# Patient Record
Sex: Female | Born: 1975 | Race: White | Hispanic: No | Marital: Married | Smoking: Current every day smoker
Health system: Southern US, Community
[De-identification: ages and names within clinical notes are randomized; demographics above are authoritative.]

## PROBLEM LIST (undated history)

## (undated) DIAGNOSIS — M5430 Sciatica, unspecified side: Secondary | ICD-10-CM

## (undated) DIAGNOSIS — M5136 Other intervertebral disc degeneration, lumbar region: Secondary | ICD-10-CM

## (undated) DIAGNOSIS — M5137 Other intervertebral disc degeneration, lumbosacral region: Secondary | ICD-10-CM

## (undated) DIAGNOSIS — F419 Anxiety disorder, unspecified: Secondary | ICD-10-CM

## (undated) DIAGNOSIS — M5126 Other intervertebral disc displacement, lumbar region: Secondary | ICD-10-CM

## (undated) HISTORY — PX: LAPAROSCOPIC ENDOMETRIOSIS FULGURATION: SUR769

## (undated) HISTORY — PX: CLAVICLE SURGERY: SHX598

---

## 2013-02-07 ENCOUNTER — Encounter (HOSPITAL_COMMUNITY): Payer: Self-pay | Admitting: *Deleted

## 2013-02-07 ENCOUNTER — Emergency Department (HOSPITAL_COMMUNITY)
Admission: EM | Admit: 2013-02-07 | Discharge: 2013-02-08 | Disposition: A | Discharge status: Home / Self Care | Payer: Medicare Other | Attending: Emergency Medicine | Admitting: Emergency Medicine

## 2013-02-07 DIAGNOSIS — M545 Low back pain: Secondary | ICD-10-CM

## 2013-02-07 DIAGNOSIS — R209 Unspecified disturbances of skin sensation: Secondary | ICD-10-CM | POA: Insufficient documentation

## 2013-02-07 DIAGNOSIS — Z8739 Personal history of other diseases of the musculoskeletal system and connective tissue: Secondary | ICD-10-CM | POA: Insufficient documentation

## 2013-02-07 DIAGNOSIS — F17200 Nicotine dependence, unspecified, uncomplicated: Secondary | ICD-10-CM | POA: Insufficient documentation

## 2013-02-07 DIAGNOSIS — Y929 Unspecified place or not applicable: Secondary | ICD-10-CM | POA: Insufficient documentation

## 2013-02-07 DIAGNOSIS — X500XXA Overexertion from strenuous movement or load, initial encounter: Secondary | ICD-10-CM | POA: Insufficient documentation

## 2013-02-07 DIAGNOSIS — Y9389 Activity, other specified: Secondary | ICD-10-CM | POA: Insufficient documentation

## 2013-02-07 DIAGNOSIS — Z8659 Personal history of other mental and behavioral disorders: Secondary | ICD-10-CM | POA: Insufficient documentation

## 2013-02-07 DIAGNOSIS — T148XXA Other injury of unspecified body region, initial encounter: Secondary | ICD-10-CM

## 2013-02-07 DIAGNOSIS — IMO0002 Reserved for concepts with insufficient information to code with codable children: Secondary | ICD-10-CM | POA: Insufficient documentation

## 2013-02-07 HISTORY — DX: Anxiety disorder, unspecified: F41.9

## 2013-02-07 HISTORY — DX: Other intervertebral disc degeneration, lumbosacral region: M51.37

## 2013-02-07 MED ORDER — IBUPROFEN 800 MG PO TABS: 800.0000 mg | ORAL_TABLET | Freq: Three times a day (TID) | ORAL | Status: DC

## 2013-02-07 MED ORDER — CYCLOBENZAPRINE HCL 10 MG PO TABS: 10.0000 mg | ORAL_TABLET | Freq: Two times a day (BID) | ORAL | Status: DC | PRN

## 2013-02-07 MED ORDER — CYCLOBENZAPRINE HCL 10 MG PO TABS
10.0000 mg | ORAL_TABLET | Freq: Once | ORAL | Status: AC
  Administered 2013-02-07: 10 mg via ORAL
  Filled 2013-02-07: qty 1

## 2013-02-07 MED ORDER — HYDROCODONE-ACETAMINOPHEN 5-325 MG PO TABS: 1.0000 | ORAL_TABLET | ORAL | Status: DC | PRN

## 2013-02-07 MED ORDER — HYDROCODONE-ACETAMINOPHEN 5-325 MG PO TABS
1.0000 | ORAL_TABLET | Freq: Once | ORAL | Status: AC
  Administered 2013-02-07: 1 via ORAL
  Filled 2013-02-07: qty 1

## 2013-02-07 NOTE — ED Notes (Addendum)
C/o L low back pain, onset Thursday night, "felt a pop", gradually progressively worse, today developed radiation down L leg, describes as burning, worse when sitting. Very anxious. Keeps mentioning fearful of possibilities of sx.

## 2013-02-07 NOTE — ED Provider Notes (Signed)
History    This chart was scribed for non-physician practitioner, Arnoldo Hooker, PA-C, working with Hurman Horn, MD by Melba Coon, ED Scribe. This patient was seen in room TR10C/TR10C and the patient's care was started at 11:10PM.   CSN: [id number]  Arrival date & time 02/07/13  2204   First MD Initiated Contact with Patient 02/07/13 2245      Chief Complaint  Patient presents with  . Back Pain    (Consider location/radiation/quality/duration/timing/severity/associated sxs/prior treatment) The history is provided by the patient. No language interpreter was used.   Shawna Brown is a 37 y.o. female who presents to the Emergency Department complaining of constant, moderate to severe,burning, lower back pain that radiates down her left leg with numbness with a gradual onset 3 night ago that has gotten progressively worse. She reports a history of DDD without surgery. She reports she was helping to move a couch when she felt a "pop"; she does not think it was muscular. Sitting aggravates the back pain. Laying down and heating pad alleviates the back pain. She has not tried ice at home. She reports she cannot lift her left leg at times and has decreased strength in her left leg compared to her right leg. Denies HA, fever, neck pain, sore throat, rash, CP, SOB, abdominal pain, nausea, emesis, diarrhea, dysuria, bowel or bladder dysfunction, or extremity edema, weakness, or tingling. No IV drug abuse. History of anxiety. No known allergies. No other pertinent medical symptoms. She is a current everyday smoker.   Past Medical History  Diagnosis Date  . Anxiety   . DDD (degenerative disc disease), lumbosacral     childhood DX    Past Surgical History  Procedure Laterality Date  . Clavicle surgery      LEFT  . Laparoscopic endometriosis fulguration      No family history on file.  History  Substance Use Topics  . Smoking status: Current Every Day Smoker  . Smokeless tobacco:  Not on file  . Alcohol Use: Yes    OB History   Grav Para Term Preterm Abortions TAB SAB Ect Mult Living                  Review of Systems  Musculoskeletal: Positive for back pain.   10 Systems reviewed and all are negative for acute change except as noted in the HPI.   Allergies  Review of patient's allergies indicates no known allergies.  Home Medications   Current Outpatient Rx  Name  Route  Sig  Dispense  Refill  . ibuprofen (ADVIL,MOTRIN) 200 MG tablet   Oral   Take 800 mg by mouth every 6 (six) hours as needed for pain.           BP 118/63  Pulse 100  Temp(Src) 98.4 F (36.9 C) (Oral)  Resp 16  SpO2 100%  LMP 01/28/2013  Physical Exam  Nursing note and vitals reviewed. Constitutional: She is oriented to person, place, and time. She appears well-developed and well-nourished. No distress.  HENT:  Head: Normocephalic and atraumatic.  Eyes: EOM are normal.  Neck: Normal range of motion. Neck supple. No tracheal deviation present.  Cardiovascular: Normal rate.   Pulmonary/Chest: Effort normal. No respiratory distress.  Abdominal: Soft. There is no tenderness.  Musculoskeletal: Normal range of motion. She exhibits tenderness. She exhibits no edema.  Mild paralumbar tenderness without swelling with full ROM limited only by pain. Normal reflexes equal bilaterally with distal pulses intact and equal in  lower extremities.  Neurological: She is alert and oriented to person, place, and time.  Skin: Skin is warm and dry. No rash noted.  Psychiatric: She has a normal mood and affect. Her behavior is normal.    ED Course  Procedures (including critical care time)  DIAGNOSTIC STUDIES: Oxygen Saturation is 100% on room air, normal by my interpretation.    COORDINATION OF CARE:  11:15PM - Norco, ibuprofen, and flexeril will be ordered for Alric Quan. She is advised to take rest at home and apply ice to tender areas. She is ready for d/c.   Labs Reviewed -  No data to display No results found.   No diagnosis found. 1. Lower back pain   MDM  Uncomplicated low back pain following musculoskeletal pattern, without neurologic deficits.    I personally performed the services described in this documentation, which was scribed in my presence. The recorded information has been reviewed and is accurate.        Arnoldo Hooker, PA-C 02/09/13 0007

## 2013-02-09 NOTE — ED Provider Notes (Signed)
Medical screening examination/treatment/procedure(s) were performed by non-physician practitioner and as supervising physician I was immediately available for consultation/collaboration.   Murray Durrell M Lennan Malone, MD 02/09/13 1722 

## 2013-02-23 ENCOUNTER — Encounter (HOSPITAL_COMMUNITY): Payer: Self-pay | Admitting: Emergency Medicine

## 2013-02-23 ENCOUNTER — Emergency Department (HOSPITAL_COMMUNITY)
Admission: EM | Admit: 2013-02-23 | Discharge: 2013-02-23 | Disposition: A | Discharge status: Home / Self Care | Payer: Medicare Other | Attending: Emergency Medicine | Admitting: Emergency Medicine

## 2013-02-23 DIAGNOSIS — M545 Low back pain, unspecified: Secondary | ICD-10-CM | POA: Insufficient documentation

## 2013-02-23 DIAGNOSIS — Z8739 Personal history of other diseases of the musculoskeletal system and connective tissue: Secondary | ICD-10-CM | POA: Insufficient documentation

## 2013-02-23 DIAGNOSIS — R209 Unspecified disturbances of skin sensation: Secondary | ICD-10-CM | POA: Insufficient documentation

## 2013-02-23 DIAGNOSIS — M5430 Sciatica, unspecified side: Secondary | ICD-10-CM | POA: Insufficient documentation

## 2013-02-23 DIAGNOSIS — G8929 Other chronic pain: Secondary | ICD-10-CM | POA: Insufficient documentation

## 2013-02-23 DIAGNOSIS — R5381 Other malaise: Secondary | ICD-10-CM | POA: Insufficient documentation

## 2013-02-23 DIAGNOSIS — F17200 Nicotine dependence, unspecified, uncomplicated: Secondary | ICD-10-CM | POA: Insufficient documentation

## 2013-02-23 DIAGNOSIS — Z8659 Personal history of other mental and behavioral disorders: Secondary | ICD-10-CM | POA: Insufficient documentation

## 2013-02-23 HISTORY — DX: Other intervertebral disc displacement, lumbar region: M51.26

## 2013-02-23 HISTORY — DX: Sciatica, unspecified side: M54.30

## 2013-02-23 HISTORY — DX: Other intervertebral disc degeneration, lumbar region: M51.36

## 2013-02-23 MED ORDER — HYDROMORPHONE HCL PF 2 MG/ML IJ SOLN
2.0000 mg | Freq: Once | INTRAMUSCULAR | Status: AC
  Administered 2013-02-23: 2 mg via INTRAMUSCULAR
  Filled 2013-02-23: qty 1

## 2013-02-23 MED ORDER — HYDROCODONE-ACETAMINOPHEN 5-325 MG PO TABS: 1.0000 | ORAL_TABLET | Freq: Four times a day (QID) | ORAL | Status: DC | PRN

## 2013-02-23 NOTE — ED Notes (Signed)
Patient laying on stretcher. Patient has been complaining of pain in back. Informed patient that MD would be in to see patient. No acute distress noted, resp are even and unlabored. Patient is tearful due to pain. Call light at bedside. Will continue to monitor.

## 2013-02-23 NOTE — ED Notes (Signed)
Patient given discharge instructions for back pain. rx for lortab. Advised to follow up with primary care or to return to this department if condition worsens. Patient voiced understanding of all instructions and had no further questions. Patient taken to front lobby via wheelchair.

## 2013-02-23 NOTE — ED Provider Notes (Signed)
History     CSN: [id number]  Arrival date & time 02/23/13  0036   First MD Initiated Contact with Patient 02/23/13 [phone]      Chief Complaint  Patient presents with  . Back Pain    (Consider location/radiation/quality/duration/timing/severity/associated sxs/prior treatment) Patient is a 37 y.o. female presenting with back pain. The history is provided by the patient.  Back Pain Associated symptoms: numbness and weakness   Associated symptoms: no abdominal pain, no chest pain, no dysuria, no fever and no headaches    vision long-standing history of back pain. Sarafina Puthoff more severe of late she pulled her back lifting a trailer. Also was seen here for similar complaint for back injury on March 16. Patient recently moved to the area. Patient had an MRI last around 2000 is known to have a herniated disc as per patient. Patient has symptoms consistent with some left-sided sciatica and now some mild right foot numbness. No incontinence. Current back pain is lower lumbar bilaterally radiating to both hips. Currently a 5/10 at worse 10 out of 10. Pain is described as sharp.  Past Medical History  Diagnosis Date  . Anxiety   . DDD (degenerative disc disease), lumbosacral     childhood DX  . Sciatica   . Bulging lumbar disc     Past Surgical History  Procedure Laterality Date  . Clavicle surgery      LEFT  . Laparoscopic endometriosis fulguration      History reviewed. No pertinent family history.  History  Substance Use Topics  . Smoking status: Current Every Day Smoker  . Smokeless tobacco: Not on file  . Alcohol Use: Yes    OB History   Grav Para Term Preterm Abortions TAB SAB Ect Mult Living                  Review of Systems  Constitutional: Negative for fever.  HENT: Negative for neck pain.   Respiratory: Negative for shortness of breath.   Cardiovascular: Negative for chest pain.  Gastrointestinal: Negative for nausea, vomiting and abdominal pain.  Genitourinary:  Negative for dysuria.  Musculoskeletal: Positive for back pain.  Skin: Negative for rash.  Neurological: Positive for weakness and numbness. Negative for facial asymmetry and headaches.  Hematological: Does not bruise/bleed easily.  Psychiatric/Behavioral: Negative for confusion.    Allergies  Review of patient's allergies indicates no known allergies.  Home Medications   Current Outpatient Rx  Name  Route  Sig  Dispense  Refill  . ibuprofen (ADVIL,MOTRIN) 200 MG tablet   Oral   Take 800 mg by mouth every 6 (six) hours as needed for pain.         Marland Kitchen HYDROcodone-acetaminophen (NORCO/VICODIN) 5-325 MG per tablet   Oral   Take 1-2 tablets by mouth every 6 (six) hours as needed for pain.   14 tablet   0     BP 105/56  Pulse 86  Temp(Src) 98.1 F (36.7 C) (Oral)  Resp 18  SpO2 96%  LMP 01/28/2013  Physical Exam  Nursing note and vitals reviewed. Constitutional: She is oriented to person, place, and time. She appears well-developed and well-nourished. No distress.  HENT:  Head: Normocephalic and atraumatic.  Mouth/Throat: Oropharynx is clear and moist.  Eyes: Conjunctivae and EOM are normal. Pupils are equal, round, and reactive to light.  Neck: Normal range of motion. Neck supple.  Cardiovascular: Normal rate, regular rhythm and normal heart sounds.   No murmur heard. Pulmonary/Chest: Effort normal and breath  sounds normal.  Abdominal: Soft. Bowel sounds are normal. There is no tenderness.  Musculoskeletal: Normal range of motion. She exhibits no edema and no tenderness.  Neurological: She is alert and oriented to person, place, and time. No cranial nerve deficit. She exhibits normal muscle tone. Coordination normal.  Except for weakness extension of the left leg. And numbness tingling of the right foot in the L5 distribution.  Skin: Skin is warm. No erythema.    ED Course  Procedures (including critical care time)  Labs Reviewed - No data to display No results  found.   1. Chronic low back pain   2. Sciatica, unspecified laterality       MDM  By history the patient has chronic back pain with a component of sciatica. She's had sciatica symptoms for 2 years. States now moving to this area her last MRI was done in 2000. Patient was significant weakness on the left side also has some toe numbness on the right side. No cauda equina symptoms. Symptoms are essentially unchanged of late. Patient with exacerbation of the back pain here today. I reviewed her visit from March 16 as well. Patient requests referral to orthopedics did not want referral to neurosurgery. So that was provided also resource guide for local area provided.        Shelda Jakes, MD 02/23/13 [phone]

## 2013-02-23 NOTE — ED Notes (Signed)
Patient laying on stretcher, complaining of back pain and right leg is numb into foot. Patient tearful at this time. States her pain is 9/10 at this time. No acute distress noted, resp are even and unlabored. Call light at bedside. Will continue to monitor.

## 2013-02-23 NOTE — ED Notes (Signed)
Patient reports that she was seen here one week ago after she "pulled her back moving furniture".  Patient reports history of chronic back problems, including bulging discs, degenerative disc disease, and sciatica.  Patient reports that she has been taking tramadol and ibuprofen that has been helping with the pain, but patient wakes up in 3 am every morning with severe back pain.  Reports severe back pain during bowel movements; denies incontinence.

## 2014-06-10 ENCOUNTER — Encounter (HOSPITAL_COMMUNITY): Payer: Self-pay | Admitting: Emergency Medicine

## 2014-06-10 ENCOUNTER — Emergency Department (HOSPITAL_COMMUNITY): Payer: Medicare Other

## 2014-06-10 ENCOUNTER — Emergency Department (HOSPITAL_COMMUNITY)
Admission: EM | Admit: 2014-06-10 | Discharge: 2014-06-10 | Disposition: A | Discharge status: Home / Self Care | Payer: Medicare Other | Attending: Emergency Medicine | Admitting: Emergency Medicine

## 2014-06-10 DIAGNOSIS — N200 Calculus of kidney: Secondary | ICD-10-CM | POA: Diagnosis not present

## 2014-06-10 DIAGNOSIS — M5430 Sciatica, unspecified side: Secondary | ICD-10-CM | POA: Insufficient documentation

## 2014-06-10 DIAGNOSIS — Z8659 Personal history of other mental and behavioral disorders: Secondary | ICD-10-CM | POA: Diagnosis not present

## 2014-06-10 DIAGNOSIS — F17200 Nicotine dependence, unspecified, uncomplicated: Secondary | ICD-10-CM | POA: Diagnosis not present

## 2014-06-10 DIAGNOSIS — R109 Unspecified abdominal pain: Secondary | ICD-10-CM | POA: Diagnosis not present

## 2014-06-10 LAB — BASIC METABOLIC PANEL
ANION GAP: 12 (ref 5–15)
BUN: 13 mg/dL (ref 6–23)
CHLORIDE: 101 meq/L (ref 96–112)
CO2: 26 mEq/L (ref 19–32)
Calcium: 9.1 mg/dL (ref 8.4–10.5)
Creatinine, Ser: 0.91 mg/dL (ref 0.50–1.10)
GFR, EST NON AFRICAN AMERICAN: 78 mL/min — AB (ref >90)
Glucose, Bld: 81 mg/dL (ref 70–99)
POTASSIUM: 4.3 meq/L (ref 3.7–5.3)
SODIUM: 139 meq/L (ref 137–147)

## 2014-06-10 LAB — CBC
HCT: 37.1 % (ref 36.0–46.0)
Hemoglobin: 12.7 g/dL (ref 12.0–15.0)
MCH: 31.8 pg (ref 26.0–34.0)
MCHC: 34.2 g/dL (ref 30.0–36.0)
MCV: 93 fL (ref 78.0–100.0)
PLATELETS: 252 10*3/uL (ref 150–400)
RBC: 3.99 MIL/uL (ref 3.87–5.11)
RDW: 13.2 % (ref 11.5–15.5)
WBC: 11.7 10*3/uL — AB (ref 4.0–10.5)

## 2014-06-10 LAB — URINALYSIS, ROUTINE W REFLEX MICROSCOPIC
Bilirubin Urine: NEGATIVE
GLUCOSE, UA: NEGATIVE mg/dL
KETONES UR: NEGATIVE mg/dL
LEUKOCYTES UA: NEGATIVE
NITRITE: NEGATIVE
PROTEIN: NEGATIVE mg/dL
Specific Gravity, Urine: 1.016 (ref 1.005–1.030)
UROBILINOGEN UA: 0.2 mg/dL (ref 0.0–1.0)
pH: 5 (ref 5.0–8.0)

## 2014-06-10 LAB — URINE MICROSCOPIC-ADD ON

## 2014-06-10 MED ORDER — ONDANSETRON HCL 4 MG PO TABS: 4.0000 mg | ORAL_TABLET | Freq: Four times a day (QID) | ORAL | Status: AC

## 2014-06-10 MED ORDER — HYDROCODONE-ACETAMINOPHEN 5-325 MG PO TABS
1.0000 | ORAL_TABLET | Freq: Once | ORAL | Status: AC
  Administered 2014-06-10: 1 via ORAL
  Filled 2014-06-10: qty 1

## 2014-06-10 MED ORDER — KETOROLAC TROMETHAMINE 30 MG/ML IJ SOLN
30.0000 mg | Freq: Once | INTRAMUSCULAR | Status: AC
  Administered 2014-06-10: 30 mg via INTRAVENOUS
  Filled 2014-06-10: qty 1

## 2014-06-10 MED ORDER — ONDANSETRON HCL 4 MG PO TABS
8.0000 mg | ORAL_TABLET | Freq: Once | ORAL | Status: AC
  Administered 2014-06-10: 8 mg via ORAL
  Filled 2014-06-10: qty 2

## 2014-06-10 MED ORDER — OXYCODONE-ACETAMINOPHEN 5-325 MG PO TABS
1.0000 | ORAL_TABLET | Freq: Once | ORAL | Status: AC
  Administered 2014-06-10: 1 via ORAL
  Filled 2014-06-10: qty 1

## 2014-06-10 MED ORDER — SODIUM CHLORIDE 0.9 % IV BOLUS (SEPSIS)
1000.0000 mL | Freq: Once | INTRAVENOUS | Status: AC
Start: 2014-06-10 — End: 2014-06-10
  Administered 2014-06-10: 1000 mL via INTRAVENOUS

## 2014-06-10 MED ORDER — HYDROMORPHONE HCL PF 1 MG/ML IJ SOLN
1.0000 mg | Freq: Once | INTRAMUSCULAR | Status: AC
  Administered 2014-06-10: 1 mg via INTRAVENOUS
  Filled 2014-06-10: qty 1

## 2014-06-10 MED ORDER — HYDROCODONE-ACETAMINOPHEN 5-325 MG PO TABS: 1.0000 | ORAL_TABLET | ORAL | Status: DC | PRN

## 2014-06-10 MED ORDER — ONDANSETRON HCL 4 MG/2ML IJ SOLN
4.0000 mg | Freq: Once | INTRAMUSCULAR | Status: AC
  Administered 2014-06-10: 4 mg via INTRAVENOUS
  Filled 2014-06-10: qty 2

## 2014-06-10 NOTE — ED Notes (Addendum)
Pt states she was diagnosed with a kidney stone by CT at her local hospital last week. She has been taking vicodin at home for pain which she states was not helping and she also ran out. She is going to schedule an appt with a urologist but has not been able to yet. She c/o bladder and R flank pain and nausea.

## 2014-06-10 NOTE — Discharge Instructions (Signed)
Kidney Stones Kidney stones (urolithiasis) are solid masses that form inside your kidneys. The intense pain is caused by the stone moving through the kidney, ureter, bladder, and urethra (urinary tract). When the stone moves, the ureter starts to spasm around the stone. The stone is usually passed in your pee (urine).  HOME CARE  Drink enough fluids to keep your pee clear or pale yellow. This helps to get the stone out.  Strain all pee through the provided strainer. Do not pee without peeing through the strainer, not even once. If you pee the stone out, catch it in the strainer. The stone may be as small as a grain of salt. Take this to your doctor. This will help your doctor figure out what you can do to try to prevent more kidney stones.  Only take medicine as told by your doctor.  Follow up with your doctor as told.  Get follow-up X-rays as told by your doctor. GET HELP IF: You have pain that gets worse even if you have been taking pain medicine. GET HELP RIGHT AWAY IF:   Your pain does not get better with medicine.  You have a fever or shaking chills.  Your pain increases and gets worse over 18 hours.  You have new belly (abdominal) pain.  You feel faint or pass out.  You are unable to pee. MAKE SURE YOU:   Understand these instructions.  Will watch your condition.  Will get help right away if you are not doing well or get worse. Document Released: 04/29/2008 Document Revised: 07/14/2013 Document Reviewed: 04/14/2013 [facility] Patient Information 2015 [employer], [place]. This information is not intended to replace advice given to you by your health care provider. Make sure you discuss any questions you have with your health care provider.  Ultrasound findings  IMPRESSION:  1. Mild left hydronephrosis. Difficult to exclude acute obstructive  uropathy on that side even though the left ureteral jet is  identified.  2. Bilateral renal cortical thinning/atrophy, with a pattern  of  medullary hyper echogenicity. This may indicate nephrocalcinosis or  possibly medullary sponge kidney.

## 2014-06-10 NOTE — ED Provider Notes (Signed)
CSN: [id number]     Arrival date & time 06/10/14  1452 History   First MD Initiated Contact with Patient 06/10/14 1610     Chief Complaint  Patient presents with  . Nephrolithiasis     (Consider location/radiation/quality/duration/timing/severity/associated sxs/prior Treatment) Patient is a 38 y.o. female presenting with abdominal pain.  Abdominal Pain Pain location:  Suprapubic, R flank and L flank Pain quality: sharp   Pain radiates to:  Suprapubic region Pain severity:  Severe Timing:  Constant Associated symptoms: no chest pain, no constipation, no cough, no diarrhea, no dysuria, no nausea, no shortness of breath, no vaginal discharge and no vomiting     Past Medical History  Diagnosis Date  . Anxiety   . DDD (degenerative disc disease), lumbosacral     childhood DX  . Sciatica   . Bulging lumbar disc    Past Surgical History  Procedure Laterality Date  . Clavicle surgery      LEFT  . Laparoscopic endometriosis fulguration     History reviewed. No pertinent family history. History  Substance Use Topics  . Smoking status: Current Every Day Smoker  . Smokeless tobacco: Not on file  . Alcohol Use: Yes   OB History   Grav Para Term Preterm Abortions TAB SAB Ect Mult Living                 Review of Systems  Constitutional: Negative for activity change.  HENT: Negative for congestion.   Respiratory: Negative for cough and shortness of breath.   Cardiovascular: Negative for chest pain and leg swelling.  Gastrointestinal: Positive for abdominal pain. Negative for nausea, vomiting, diarrhea, constipation, blood in stool and abdominal distention.  Genitourinary: Positive for flank pain. Negative for dysuria and vaginal discharge.  Musculoskeletal: Negative for back pain.  Skin: Negative for color change.  Neurological: Negative for syncope and headaches.  Psychiatric/Behavioral: Negative for agitation.      Allergies  Review of patient's allergies indicates no  known allergies.  Home Medications   Prior to Admission medications   Medication Sig Start Date End Date Taking? Authorizing Provider  HYDROcodone-acetaminophen (NORCO/VICODIN) 5-325 MG per tablet Take 1-2 tablets by mouth every 6 (six) hours as needed for pain. 02/23/13   Vanetta Mulders, MD  ibuprofen (ADVIL,MOTRIN) 200 MG tablet Take 800 mg by mouth every 6 (six) hours as needed for pain.    Historical Provider, MD   BP 124/60  Pulse 85  Temp(Src) 98.5 F (36.9 C) (Oral)  Resp 18  SpO2 96% Physical Exam  Nursing note and vitals reviewed. Constitutional: She is oriented to person, place, and time. She appears well-developed.  HENT:  Head: Normocephalic.  Eyes: Pupils are equal, round, and reactive to light.  Neck: Neck supple.  Cardiovascular: Normal rate.  Exam reveals no gallop and no friction rub.   No murmur heard. Pulmonary/Chest: Effort normal and breath sounds normal. No respiratory distress.  Abdominal: Soft. She exhibits no distension. There is tenderness in the suprapubic area. There is CVA tenderness. There is no rigidity, no rebound and no guarding.  B/l flank tenderness Right worse than left  Musculoskeletal: She exhibits no edema.  Neurological: She is alert and oriented to person, place, and time.  Skin: Skin is warm.  Psychiatric: She has a normal mood and affect.    ED Course  Procedures (including critical care time) Labs Review Labs Reviewed  URINALYSIS, ROUTINE W REFLEX MICROSCOPIC - Abnormal; Notable for the following:    Hgb urine dipstick  MODERATE (*)    All other components within normal limits  CBC - Abnormal; Notable for the following:    WBC 11.7 (*)    All other components within normal limits  BASIC METABOLIC PANEL - Abnormal; Notable for the following:    GFR calc non Af Amer 78 (*)    All other components within normal limits  URINE MICROSCOPIC-ADD ON - Abnormal; Notable for the following:    Squamous Epithelial / LPF FEW (*)    Bacteria,  UA FEW (*)    All other components within normal limits    Imaging Review [facility]  06/10/2014   CLINICAL DATA:  38 year old female with increasing pain and known right side Renal stone, nephrolithiasis. Initial encounter.  EXAM: RENAL/URINARY TRACT ULTRASOUND COMPLETE  COMPARISON:  None.  FINDINGS: Right Kidney:  Length: No hydronephrosis. Thinning of the renal cortex for age. Hyperechoic medullary pyramids, but no definite shadowing identified. No space-occupying mass identified.  Left Kidney:  Length: 11.0 cm. Mild hydronephrosis (image 23). Renal calices are less dilated. Similar cortical thinning to that on the right with increased medullary echogenicity.  Bladder:  No layering debris identified.  Both ureteral jets are detected.  IMPRESSION: 1. Mild left hydronephrosis. Difficult to exclude acute obstructive uropathy on that side even though the left ureteral jet is identified. 2. Bilateral renal cortical thinning/atrophy, with a pattern of medullary hyper echogenicity. This may indicate nephrocalcinosis or possibly medullary sponge kidney.   Electronically Signed   By: Augusto GambleLee  Hall M.D.   On: 06/10/2014 17:21     EKG Interpretation None      MDM   Final diagnoses:  Nephrolithiasis   38 y/o female with history of kidney stones and recent diagnosis with kidney stone in outside hospital that presents with worsening pain not controled with PO medication. Patient states that on Tuesday she has similar pain and was diagnosed with a 4mm stone and given Norco for pain control at home. She then suddenly today and a recurrence of pain with out relief with PO Norco. At this point patient presented to the ED. She is from out of town and states she is here visiting family.   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 Patient instructed to follow-up with Nephology in regards to US, with mild hydronephrosis patient is possibly failing expectant  management. No sign of infection on U/A, Cr and BUN- WNL. Pain able to be controled in the department. At this time no indication for intervention.   Patient instructed to continue expectant management till she is able to present with the Urologist she was referred to during last visit when stone diagnosed, patient given contact for our urologist as a back-up plan and nephology number given.      Clement SayresStaci Chasitie Passey, MD 06/11/14 [phone]

## 2014-06-11 NOTE — ED Provider Notes (Signed)
I saw and evaluated the patient, reviewed the resident's note and I agree with the findings and plan.   EKG Interpretation None      Improvement in symptoms in ER. Pain relieved. Outpatient urology follow up. No signs of infection  [facility]  06/10/2014   CLINICAL DATA:  38 year old female with increasing pain and known right side Renal stone, nephrolithiasis. Initial encounter.  EXAM: RENAL/URINARY TRACT ULTRASOUND COMPLETE  COMPARISON:  None.  FINDINGS: Right Kidney:  Length: No hydronephrosis. Thinning of the renal cortex for age. Hyperechoic medullary pyramids, but no definite shadowing identified. No space-occupying mass identified.  Left Kidney:  Length: 11.0 cm. Mild hydronephrosis (image 23). Renal calices are less dilated. Similar cortical thinning to that on the right with increased medullary echogenicity.  Bladder:  No layering debris identified.  Both ureteral jets are detected.  IMPRESSION: 1. Mild left hydronephrosis. Difficult to exclude acute obstructive uropathy on that side even though the left ureteral jet is identified. 2. Bilateral renal cortical thinning/atrophy, with a pattern of medullary hyper echogenicity. This may indicate nephrocalcinosis or possibly medullary sponge kidney.   Electronically Signed   By: Augusto GambleLee  Hall M.D.   On: 06/10/2014 17:21  I personally reviewed the imaging tests through PACS system I reviewed available ER/hospitalization records through the EMR   Lyanne CoKevin M Sylena Lotter, MD 06/11/14 1538

## 2015-03-12 ENCOUNTER — Emergency Department (HOSPITAL_COMMUNITY)
Admission: EM | Admit: 2015-03-12 | Discharge: 2015-03-12 | Disposition: A | Discharge status: Home / Self Care | Payer: Medicare Other | Attending: Emergency Medicine | Admitting: Emergency Medicine

## 2015-03-12 ENCOUNTER — Encounter (HOSPITAL_COMMUNITY): Payer: Self-pay | Admitting: Emergency Medicine

## 2015-03-12 DIAGNOSIS — Z791 Long term (current) use of non-steroidal anti-inflammatories (NSAID): Secondary | ICD-10-CM | POA: Diagnosis not present

## 2015-03-12 DIAGNOSIS — Y9289 Other specified places as the place of occurrence of the external cause: Secondary | ICD-10-CM | POA: Diagnosis not present

## 2015-03-12 DIAGNOSIS — Y9389 Activity, other specified: Secondary | ICD-10-CM | POA: Insufficient documentation

## 2015-03-12 DIAGNOSIS — Z720 Tobacco use: Secondary | ICD-10-CM | POA: Diagnosis not present

## 2015-03-12 DIAGNOSIS — Y999 Unspecified external cause status: Secondary | ICD-10-CM | POA: Insufficient documentation

## 2015-03-12 DIAGNOSIS — F419 Anxiety disorder, unspecified: Secondary | ICD-10-CM | POA: Insufficient documentation

## 2015-03-12 DIAGNOSIS — Z79899 Other long term (current) drug therapy: Secondary | ICD-10-CM | POA: Diagnosis not present

## 2015-03-12 DIAGNOSIS — S3992XA Unspecified injury of lower back, initial encounter: Secondary | ICD-10-CM | POA: Insufficient documentation

## 2015-03-12 DIAGNOSIS — M5432 Sciatica, left side: Secondary | ICD-10-CM | POA: Diagnosis not present

## 2015-03-12 DIAGNOSIS — W010XXA Fall on same level from slipping, tripping and stumbling without subsequent striking against object, initial encounter: Secondary | ICD-10-CM | POA: Insufficient documentation

## 2015-03-12 MED ORDER — METHOCARBAMOL 500 MG PO TABS: 500.0000 mg | ORAL_TABLET | Freq: Two times a day (BID) | ORAL | Status: DC

## 2015-03-12 MED ORDER — HYDROCODONE-ACETAMINOPHEN 5-325 MG PO TABS
1.0000 | ORAL_TABLET | Freq: Once | ORAL | Status: AC
  Administered 2015-03-12: 1 via ORAL
  Filled 2015-03-12: qty 1

## 2015-03-12 MED ORDER — DIAZEPAM 2 MG PO TABS
2.0000 mg | ORAL_TABLET | Freq: Once | ORAL | Status: AC
  Administered 2015-03-12: 2 mg via ORAL
  Filled 2015-03-12: qty 1

## 2015-03-12 MED ORDER — IBUPROFEN 600 MG PO TABS: 600.0000 mg | ORAL_TABLET | Freq: Four times a day (QID) | ORAL | Status: AC | PRN

## 2015-03-12 MED ORDER — HYDROCODONE-ACETAMINOPHEN 5-325 MG PO TABS: 1.0000 | ORAL_TABLET | Freq: Four times a day (QID) | ORAL | Status: AC | PRN

## 2015-03-12 MED ORDER — IBUPROFEN 200 MG PO TABS
600.0000 mg | ORAL_TABLET | Freq: Once | ORAL | Status: AC
  Administered 2015-03-12: 600 mg via ORAL
  Filled 2015-03-12 (×2): qty 1

## 2015-03-12 NOTE — Discharge Instructions (Signed)
You have a sciatica flare up. Please take the pain meds prescribed. Please dont mix the meds with any other narcotic pain meds or muscle relaxants.  See the spine surgeon as you have intended.   Back Exercises Back exercises help treat and prevent back injuries. The goal of back exercises is to increase the strength of your abdominal and back muscles and the flexibility of your back. These exercises should be started when you no longer have back pain. Back exercises include:  Pelvic Tilt. Lie on your back with your knees bent. Tilt your pelvis until the lower part of your back is against the floor. Hold this position 5 to 10 sec and repeat 5 to 10 times.  Knee to Chest. Pull first 1 knee up against your chest and hold for 20 to 30 seconds, repeat this with the other knee, and then both knees. This may be done with the other leg straight or bent, whichever feels better.  Sit-Ups or Curl-Ups. Bend your knees 90 degrees. Start with tilting your pelvis, and do a partial, slow sit-up, lifting your trunk only 30 to 45 degrees off the floor. Take at least 2 to 3 seconds for each sit-up. Do not do sit-ups with your knees out straight. If partial sit-ups are difficult, simply do the above but with only tightening your abdominal muscles and holding it as directed.  Hip-Lift. Lie on your back with your knees flexed 90 degrees. Push down with your feet and shoulders as you raise your hips a couple inches off the floor; hold for 10 seconds, repeat 5 to 10 times.  Back arches. Lie on your stomach, propping yourself up on bent elbows. Slowly press on your hands, causing an arch in your low back. Repeat 3 to 5 times. Any initial stiffness and discomfort should lessen with repetition over time.  Shoulder-Lifts. Lie face down with arms beside your body. Keep hips and torso pressed to floor as you slowly lift your head and shoulders off the floor. Do not overdo your exercises, especially in the beginning. Exercises  may cause you some mild back discomfort which lasts for a few minutes; however, if the pain is more severe, or lasts for more than 15 minutes, do not continue exercises until you see your caregiver. Improvement with exercise therapy for back problems is slow.  See your caregivers for assistance with developing a proper back exercise program. Document Released: 12/19/2004 Document Revised: 02/03/2012 Document Reviewed: 09/12/2011 [facility] Patient Information 2015 [place], [place]. This information is not intended to replace advice given to you by your health care provider. Make sure you discuss any questions you have with your health care provider.  Sciatica Sciatica is pain, weakness, numbness, or tingling along the path of the sciatic nerve. The nerve starts in the lower back and runs down the back of each leg. The nerve controls the muscles in the lower leg and in the back of the knee, while also providing sensation to the back of the thigh, lower leg, and the sole of your foot. Sciatica is a symptom of another medical condition. For instance, nerve damage or certain conditions, such as a herniated disk or bone spur on the spine, pinch or put pressure on the sciatic nerve. This causes the pain, weakness, or other sensations normally associated with sciatica. Generally, sciatica only affects one side of the body. CAUSES   Herniated or slipped disc.  Degenerative disk disease.  A pain disorder involving the narrow muscle in the buttocks (piriformis  syndrome).  Pelvic injury or fracture.  Pregnancy.  Tumor (rare). SYMPTOMS  Symptoms can vary from mild to very severe. The symptoms usually travel from the low back to the buttocks and down the back of the leg. Symptoms can include:  Mild tingling or dull aches in the lower back, leg, or hip.  Numbness in the back of the calf or sole of the foot.  Burning sensations in the lower back, leg, or hip.  Sharp pains in the lower back, leg, or  hip.  Leg weakness.  Severe back pain inhibiting movement. These symptoms may get worse with coughing, sneezing, laughing, or prolonged sitting or standing. Also, being overweight may worsen symptoms. DIAGNOSIS  Your caregiver will perform a physical exam to look for common symptoms of sciatica. He or she may ask you to do certain movements or activities that would trigger sciatic nerve pain. Other tests may be performed to find the cause of the sciatica. These may include:  Blood tests.  X-rays.  Imaging tests, such as an MRI or CT scan. TREATMENT  Treatment is directed at the cause of the sciatic pain. Sometimes, treatment is not necessary and the pain and discomfort goes away on its own. If treatment is needed, your caregiver may suggest:  Over-the-counter medicines to relieve pain.  Prescription medicines, such as anti-inflammatory medicine, muscle relaxants, or narcotics.  Applying heat or ice to the painful area.  Steroid injections to lessen pain, irritation, and inflammation around the nerve.  Reducing activity during periods of pain.  Exercising and stretching to strengthen your abdomen and improve flexibility of your spine. Your caregiver may suggest losing weight if the extra weight makes the back pain worse.  Physical therapy.  Surgery to eliminate what is pressing or pinching the nerve, such as a bone spur or part of a herniated disk. HOME CARE INSTRUCTIONS   Only take over-the-counter or prescription medicines for pain or discomfort as directed by your caregiver.  Apply ice to the affected area for 20 minutes, 3-4 times a day for the first 48-72 hours. Then try heat in the same way.  Exercise, stretch, or perform your usual activities if these do not aggravate your pain.  Attend physical therapy sessions as directed by your caregiver.  Keep all follow-up appointments as directed by your caregiver.  Do not wear high heels or shoes that do not provide proper  support.  Check your mattress to see if it is too soft. A firm mattress may lessen your pain and discomfort. SEEK IMMEDIATE MEDICAL CARE IF:   You lose control of your bowel or bladder (incontinence).  You have increasing weakness in the lower back, pelvis, buttocks, or legs.  You have redness or swelling of your back.  You have a burning sensation when you urinate.  You have pain that gets worse when you lie down or awakens you at night.  Your pain is worse than you have experienced in the past.  Your pain is lasting longer than 4 weeks.  You are suddenly losing weight without reason. MAKE SURE YOU:  Understand these instructions.  Will watch your condition.  Will get help right away if you are not doing well or get worse. Document Released: 11/05/2001 Document Revised: 05/12/2012 Document Reviewed: 03/22/2012 [facility] Patient Information 2015 [place]. This information is not intended to replace advice given to you by your health care provider. Make sure you discuss any questions you have with your health care provider.

## 2015-03-12 NOTE — ED Provider Notes (Signed)
CSN: [id number]     Arrival date & time 03/12/15  0026 History  This chart was scribed for Derwood Kaplan, MD by Freida Busman, ED Scribe. This patient was seen in room D36C/D36C and the patient's care was started 3:51 AM.   Chief Complaint  Patient presents with  . Fall  . Back Pain     The history is provided by the patient. No language interpreter was used.     HPI Comments:  Shawna Brown is a 38 y.o. female with a history of DDD and sciatica who presents to the Emergency Department s/p fall 1 day ago complaining of constant back pain that radiates down her LLE following the incidence. She tripped over her dogs and fell off the second step of her porch; no LOC, bowel/bladder incontinence . Pt has ambulated since the fall but reported increased pain with ambulation. She notes her pain today is similar to past sciatica exacerbations.  She has taken tramadol with minimal relief.     Past Medical History  Diagnosis Date  . Anxiety   . DDD (degenerative disc disease), lumbosacral     childhood DX  . Sciatica   . Bulging lumbar disc    Past Surgical History  Procedure Laterality Date  . Clavicle surgery      LEFT  . Laparoscopic endometriosis fulguration     No family history on file. History  Substance Use Topics  . Smoking status: Current Every Day Smoker  . Smokeless tobacco: Not on file  . Alcohol Use: Yes   OB History    No data available     Review of Systems  Genitourinary:       No bowel/bladder incontinence.   Musculoskeletal: Positive for back pain.  Neurological: Negative for weakness.      Allergies  Review of patient's allergies indicates no known allergies.  Home Medications   Prior to Admission medications   Medication Sig Start Date End Date Taking? Authorizing Provider  clonazePAM (KLONOPIN) 1 MG tablet Take 1 mg by mouth daily as needed for anxiety.   Yes Historical Provider, MD  meloxicam (MOBIC) 15 MG tablet Take 15 mg by mouth daily.    Yes Historical Provider, MD  Prenatal Vit-Fe Fumarate-FA (PRENATAL MULTIVITAMIN) TABS tablet Take 1 tablet by mouth every morning.   Yes Historical Provider, MD  venlafaxine XR (EFFEXOR-XR) 150 MG 24 hr capsule Take 150 mg by mouth daily with breakfast.   Yes Historical Provider, MD  HYDROcodone-acetaminophen (NORCO/VICODIN) 5-325 MG per tablet Take 1 tablet by mouth every 6 (six) hours as needed. 03/12/15   Derwood Kaplan, MD  ibuprofen (ADVIL,MOTRIN) 600 MG tablet Take 1 tablet (600 mg total) by mouth every 6 (six) hours as needed. 03/12/15   Derwood Kaplan, MD  methocarbamol (ROBAXIN) 500 MG tablet Take 1 tablet (500 mg total) by mouth 2 (two) times daily. 03/12/15   Derwood Kaplan, MD  ondansetron (ZOFRAN) 4 MG tablet Take 1 tablet (4 mg total) by mouth every 6 (six) hours. Patient not taking: Reported on 03/12/2015 06/10/14   Clement Sayres, MD  sertraline (ZOLOFT) 100 MG tablet Take 100 mg by mouth daily.    Historical Provider, MD  venlafaxine XR (EFFEXOR-XR) 37.5 MG 24 hr capsule Take 37.5 mg by mouth every evening.    Historical Provider, MD   BP 98/61 mmHg  Pulse 65  Temp(Src) 98.5 F (36.9 C) (Oral)  Resp 18  Ht  (1.702 m)  Wt 199 lb (90.266 kg)  BMI  31.16 kg/m2  SpO2 100%  LMP 03/12/2015 Physical Exam  Constitutional: She is oriented to person, place, and time. She appears well-developed and well-nourished.  HENT:  Head: Normocephalic and atraumatic.  Cardiovascular: Normal rate.   Pulmonary/Chest: Effort normal.  Abdominal: She exhibits no distension.  Musculoskeletal:  L spine TTP extending into the sacral region. No flank tenderness. No stepoffs appreciated. 2+ patellar reflex bilaterally. Pelvis stable  Neurological: She is alert and oriented to person, place, and time.  Able to discriminate between sharp and dull bilaterally Subjective numbness LLE Positive straight leg raise in the LLE  Skin: Skin is warm and dry.  Psychiatric: She has a normal mood and affect.   Nursing note and vitals reviewed.   ED Course  Procedures   DIAGNOSTIC STUDIES:  Oxygen Saturation is 96% on RA, normal by my interpretation.    COORDINATION OF CARE:  3:59 AM Will order pain meds.  Discussed treatment plan with pt at bedside and pt agreed to plan.  Labs Review Labs Reviewed - No data to display  Imaging Review No results found.   EKG Interpretation None      MDM   Final diagnoses:  Sciatica, left    Pt comes in with fall. She has sciatica flare. She has no red flags suggestive of cord involvement. Pain meds given, pt is ambulating.     Derwood KaplanAnkit Laruth Hanger, MD 03/12/15 [phone]

## 2015-03-12 NOTE — ED Notes (Signed)
Pt. lost her balance and fell at home this evening , no LOC /ambulatory , reports pain at lower back and bilateral upper thigh . Alert and oriented /respirations unlabored .

## 2016-02-05 ENCOUNTER — Emergency Department: Payer: Medicare Other

## 2016-02-05 ENCOUNTER — Encounter: Payer: Self-pay | Admitting: *Deleted

## 2016-02-05 ENCOUNTER — Emergency Department
Admission: EM | Admit: 2016-02-05 | Discharge: 2016-02-05 | Disposition: A | Discharge status: Home / Self Care | Payer: Medicare Other | Attending: Emergency Medicine | Admitting: Emergency Medicine

## 2016-02-05 DIAGNOSIS — Z3202 Encounter for pregnancy test, result negative: Secondary | ICD-10-CM | POA: Diagnosis not present

## 2016-02-05 DIAGNOSIS — F17200 Nicotine dependence, unspecified, uncomplicated: Secondary | ICD-10-CM | POA: Diagnosis not present

## 2016-02-05 DIAGNOSIS — Z791 Long term (current) use of non-steroidal anti-inflammatories (NSAID): Secondary | ICD-10-CM | POA: Diagnosis not present

## 2016-02-05 DIAGNOSIS — Y9289 Other specified places as the place of occurrence of the external cause: Secondary | ICD-10-CM | POA: Insufficient documentation

## 2016-02-05 DIAGNOSIS — Y998 Other external cause status: Secondary | ICD-10-CM | POA: Insufficient documentation

## 2016-02-05 DIAGNOSIS — M5441 Lumbago with sciatica, right side: Secondary | ICD-10-CM | POA: Insufficient documentation

## 2016-02-05 DIAGNOSIS — M5442 Lumbago with sciatica, left side: Secondary | ICD-10-CM | POA: Insufficient documentation

## 2016-02-05 DIAGNOSIS — Y9389 Activity, other specified: Secondary | ICD-10-CM | POA: Insufficient documentation

## 2016-02-05 DIAGNOSIS — G8929 Other chronic pain: Secondary | ICD-10-CM | POA: Insufficient documentation

## 2016-02-05 DIAGNOSIS — Z79899 Other long term (current) drug therapy: Secondary | ICD-10-CM | POA: Diagnosis not present

## 2016-02-05 DIAGNOSIS — W19XXXA Unspecified fall, initial encounter: Secondary | ICD-10-CM

## 2016-02-05 DIAGNOSIS — S3992XA Unspecified injury of lower back, initial encounter: Secondary | ICD-10-CM | POA: Diagnosis present

## 2016-02-05 DIAGNOSIS — W010XXA Fall on same level from slipping, tripping and stumbling without subsequent striking against object, initial encounter: Secondary | ICD-10-CM | POA: Diagnosis not present

## 2016-02-05 LAB — POCT PREGNANCY, URINE: Preg Test, Ur: NEGATIVE

## 2016-02-05 MED ORDER — HYDROCODONE-ACETAMINOPHEN 5-325 MG PO TABS
1.0000 | ORAL_TABLET | Freq: Once | ORAL | Status: AC
  Administered 2016-02-05: 1 via ORAL
  Filled 2016-02-05: qty 1

## 2016-02-05 MED ORDER — KETOROLAC TROMETHAMINE 60 MG/2ML IM SOLN
60.0000 mg | Freq: Once | INTRAMUSCULAR | Status: DC
  Filled 2016-02-05: qty 2

## 2016-02-05 MED ORDER — METHOCARBAMOL 500 MG PO TABS: 500.0000 mg | ORAL_TABLET | Freq: Four times a day (QID) | ORAL | Status: AC

## 2016-02-05 NOTE — Discharge Instructions (Signed)
Chronic Back Pain ° When back pain lasts longer than 3 months, it is called chronic back pain. People with chronic back pain often go through certain periods that are more intense (flare-ups).  °CAUSES °Chronic back pain can be caused by wear and tear (degeneration) on different structures in your back. These structures include: °· The bones of your spine (vertebrae) and the joints surrounding your spinal cord and nerve roots (facets). °· The strong, fibrous tissues that connect your vertebrae (ligaments). °Degeneration of these structures may result in pressure on your nerves. This can lead to constant pain. °HOME CARE INSTRUCTIONS °· Avoid bending, heavy lifting, prolonged sitting, and activities which make the problem worse. °· Take brief periods of rest throughout the day to reduce your pain. Lying down or standing usually is better than sitting while you are resting. °· Take over-the-counter or prescription medicines only as directed by your caregiver. °SEEK IMMEDIATE MEDICAL CARE IF:  °· You have weakness or numbness in one of your legs or feet. °· You have trouble controlling your bladder or bowels. °· You have nausea, vomiting, abdominal pain, shortness of breath, or fainting. °  °This information is not intended to replace advice given to you by your health care provider. Make sure you discuss any questions you have with your health care provider. °  °Document Released: 12/19/2004 Document Revised: 02/03/2012 Document Reviewed: 05/01/2015 °Elsevier Interactive Patient Education ©2016 [employer] ° °Sciatica °Sciatica is pain, weakness, numbness, or tingling along the path of the sciatic nerve. The nerve starts in the lower back and runs down the back of each leg. The nerve controls the muscles in the lower leg and in the back of the knee, while also providing sensation to the back of the thigh, lower leg, and the sole of your foot. Sciatica is a symptom of another medical condition. For instance, nerve  damage or certain conditions, such as a herniated disk or bone spur on the spine, pinch or put pressure on the sciatic nerve. This causes the pain, weakness, or other sensations normally associated with sciatica. Generally, sciatica only affects one side of the body. °CAUSES  °· Herniated or slipped disc. °· Degenerative disk disease. °· A pain disorder involving the narrow muscle in the buttocks (piriformis syndrome). °· Pelvic injury or fracture. °· Pregnancy. °· Tumor (rare). °SYMPTOMS  °Symptoms can vary from mild to very severe. The symptoms usually travel from the low back to the buttocks and down the back of the leg. Symptoms can include: °· Mild tingling or dull aches in the lower back, leg, or hip. °· Numbness in the back of the calf or sole of the foot. °· Burning sensations in the lower back, leg, or hip. °· Sharp pains in the lower back, leg, or hip. °· Leg weakness. °· Severe back pain inhibiting movement. °These symptoms may get worse with coughing, sneezing, laughing, or prolonged sitting or standing. Also, being overweight may worsen symptoms. °DIAGNOSIS  °Your caregiver will perform a physical exam to look for common symptoms of sciatica. He or she may ask you to do certain movements or activities that would trigger sciatic nerve pain. Other tests may be performed to find the cause of the sciatica. These may include: °· Blood tests. °· X-rays. °· Imaging tests, such as an MRI or CT scan. °TREATMENT  °Treatment is directed at the cause of the sciatic pain. Sometimes, treatment is not necessary and the pain and discomfort goes away on its own. If treatment is needed, your   caregiver may suggest: °· Over-the-counter medicines to relieve pain. °· Prescription medicines, such as anti-inflammatory medicine, muscle relaxants, or narcotics. °· Applying heat or ice to the painful area. °· Steroid injections to lessen pain, irritation, and inflammation around the nerve. °· Reducing activity during periods of  pain. °· Exercising and stretching to strengthen your abdomen and improve flexibility of your spine. Your caregiver may suggest losing weight if the extra weight makes the back pain worse. °· Physical therapy. °· Surgery to eliminate what is pressing or pinching the nerve, such as a bone spur or part of a herniated disk. °HOME CARE INSTRUCTIONS  °· Only take over-the-counter or prescription medicines for pain or discomfort as directed by your caregiver. °· Apply ice to the affected area for 20 minutes, 3-4 times a day for the first 48-72 hours. Then try heat in the same way. °· Exercise, stretch, or perform your usual activities if these do not aggravate your pain. °· Attend physical therapy sessions as directed by your caregiver. °· Keep all follow-up appointments as directed by your caregiver. °· Do not wear high heels or shoes that do not provide proper support. °· Check your mattress to see if it is too soft. A firm mattress may lessen your pain and discomfort. °SEEK IMMEDIATE MEDICAL CARE IF:  °· You lose control of your bowel or bladder (incontinence). °· You have increasing weakness in the lower back, pelvis, buttocks, or legs. °· You have redness or swelling of your back. °· You have a burning sensation when you urinate. °· You have pain that gets worse when you lie down or awakens you at night. °· Your pain is worse than you have experienced in the past. °· Your pain is lasting longer than 4 weeks. °· You are suddenly losing weight without reason. °MAKE SURE YOU: °· Understand these instructions. °· Will watch your condition. °· Will get help right away if you are not doing well or get worse. °  °This information is not intended to replace advice given to you by your health care provider. Make sure you discuss any questions you have with your health care provider. °  °Document Released: 11/05/2001 Document Revised: 08/02/2015 Document Reviewed: 03/22/2012 °Elsevier Interactive Patient Education ©2016  [employer] ° °

## 2016-02-05 NOTE — ED Provider Notes (Signed)
[facility] Emergency Department Provider Note  ____________________________________________  Time seen: Approximately 6:13 PM  I have reviewed the triage vital signs and the nursing notes.   HISTORY  Chief Complaint Back Pain    HPI Shawna Brown is a 40 y.o. female who presents emergency department complaining of lower back pain. Patient states that she has chronic lower back pain with degenerative disc disease. She states that she was walking and her niece's house when the dog came behind her and she tripped and fell landing on her back. Patient is complaining of increased lower back pain. She has chronic bilateral sciatica and states there is no change from baseline. She denies hitting her head or lose consciousness.   Past Medical History  Diagnosis Date  . Anxiety   . DDD (degenerative disc disease), lumbosacral     childhood DX  . Sciatica   . Bulging lumbar disc     Patient Active Problem List   Diagnosis Date Noted  . Sciatica     Past Surgical History  Procedure Laterality Date  . Clavicle surgery      LEFT  . Laparoscopic endometriosis fulguration      Current Outpatient Rx  Name  Route  Sig  Dispense  Refill  . clonazePAM (KLONOPIN) 1 MG tablet   Oral   Take 1 mg by mouth daily as needed for anxiety.         Marland Kitchen HYDROcodone-acetaminophen (NORCO/VICODIN) 5-325 MG per tablet   Oral   Take 1 tablet by mouth every 6 (six) hours as needed.   15 tablet   0   . ibuprofen (ADVIL,MOTRIN) 600 MG tablet   Oral   Take 1 tablet (600 mg total) by mouth every 6 (six) hours as needed.   30 tablet   0   . meloxicam (MOBIC) 15 MG tablet   Oral   Take 15 mg by mouth daily.         . methocarbamol (ROBAXIN) 500 MG tablet   Oral   Take 1 tablet (500 mg total) by mouth 4 (four) times daily.   16 tablet   0   . ondansetron (ZOFRAN) 4 MG tablet   Oral   Take 1 tablet (4 mg total) by mouth every 6 (six) hours. Patient not taking:  Reported on 03/12/2015   20 tablet   0   . Prenatal Vit-Fe Fumarate-FA (PRENATAL MULTIVITAMIN) TABS tablet   Oral   Take 1 tablet by mouth every morning.         . sertraline (ZOLOFT) 100 MG tablet   Oral   Take 100 mg by mouth daily.         Marland Kitchen venlafaxine XR (EFFEXOR-XR) 150 MG 24 hr capsule   Oral   Take 150 mg by mouth daily with breakfast.         . venlafaxine XR (EFFEXOR-XR) 37.5 MG 24 hr capsule   Oral   Take 37.5 mg by mouth every evening.           Allergies Review of patient's allergies indicates no known allergies.  No family history on file.  Social History Social History  Substance Use Topics  . Smoking status: Current Every Day Smoker  . Smokeless tobacco: None  . Alcohol Use: Yes     Review of Systems  Constitutional: No fever/chills Eyes: No visual changes. Cardiovascular: no chest pain. Respiratory: no cough. No SOB. Gastrointestinal: No abdominal pain.  No nausea, no vomiting.  Musculoskeletal: Positive for back pain. Skin: Negative for rash. Neurological: Negative for headaches, focal weakness or numbness. 10-point ROS otherwise negative.  ____________________________________________   PHYSICAL EXAM:  VITAL SIGNS: ED Triage Vitals  Enc Vitals Group     BP 02/05/16 1614 152/74 mmHg     Pulse Rate 02/05/16 1614 110     Resp 02/05/16 1614 22     Temp 02/05/16 1614 98.4 F (36.9 C)     Temp Source 02/05/16 1614 Oral     SpO2 02/05/16 1614 99 %     Weight 02/05/16 1614 200 lb (90.719 kg)     Height 02/05/16 1614  (1.702 m)     Head Cir --      Peak Flow --      Pain Score 02/05/16 1612 10     Pain Loc --      Pain Edu? --      Excl. in GC? --      Constitutional: Alert and oriented. Well appearing and in no acute distress. Eyes: Conjunctivae are normal. PERRL. EOMI. Head: Atraumatic. Cardiovascular: Normal rate, regular rhythm. Normal S1 and S2.  Good peripheral circulation. Respiratory: Normal respiratory effort  without tachypnea or retractions. Lungs CTAB. Musculoskeletal: No visible deformity to spot up inspection. Patient is diffusely tender to palpation in the lumbar region both over the spine and paraspinal muscle groups. No palpable abnormality. No point tenderness. Positive straight leg raise bilaterally. Dorsalis pedis pulses appreciated bilaterally. Sensation is decreased bilaterally but patient reports no change from baseline. Neurologic:  Normal speech and language. No gross focal neurologic deficits are appreciated.  Skin:  Skin is warm, dry and intact. No rash noted. Psychiatric: Mood and affect are normal. Speech and behavior are normal. Patient exhibits appropriate insight and judgement.   ____________________________________________   LABS (all labs ordered are listed, but only abnormal results are displayed)  Labs Reviewed  POC URINE PREG, ED  POCT PREGNANCY, URINE   ____________________________________________  EKG   ____________________________________________  RADIOLOGY Festus Barren Clayvon Parlett, personally viewed and evaluated these images (plain radiographs) as part of my medical decision making, as well as reviewing the written report by the radiologist.  Dg Lumbar Spine Complete  02/05/2016  CLINICAL DATA:  Low back pain.  Injury today. EXAM: LUMBAR SPINE - COMPLETE 4+ VIEW COMPARISON:  None. FINDINGS: There is anatomic alignment. There is no vertebral compression deformity. There is moderate narrowing of the L3-4, L4-5, and L5-S1 discs. There is anterior osteophyte formation throughout the lumbar spine. There is facet arthropathy a at L3-4, L4-5, and L5-S1. No pars defect. Mild degenerative change of the SI joints. IMPRESSION: No acute bony pathology.  Mild degenerative change. Electronically Signed   By: Jolaine Click M.D.   On: 02/05/2016 18:03    ____________________________________________    PROCEDURES  Procedure(s) performed:       Medications  ketorolac  (TORADOL) injection 60 mg (60 mg Intramuscular Not Given 02/05/16 1853)  HYDROcodone-acetaminophen (NORCO/VICODIN) 5-325 MG per tablet 1 tablet (not administered)     ____________________________________________   INITIAL IMPRESSION / ASSESSMENT AND PLAN / ED COURSE  Pertinent labs & imaging results that were available during my care of the patient were reviewed by me and considered in my medical decision making (see chart for details).  Patient's diagnosis is consistent with a fall resulting in an increase of her chronic lumbago with sciatica. Patient has no neurological deficits. She has no bowel or bladder dysfunction, change in sciatica. X-ray reveals no  acute osseous abnormality but does reveal some chronic degenerative changes. Patient was requesting pain medication but upon clearing the [place] controlled substance database. Patient is found received multiple prescriptions for narcotics over the past several months. Patient had 120 tramadol prescribed 10 days prior. No narcotics will be given at this time.. Patient will be discharged home with prescriptions for muscle relaxer. Patient is to follow up with spine specialist for which she has an appointment at 0900 hrs. on 02/07/2016. Patient is given ED precautions to return to the ED for any worsening or new symptoms.     ____________________________________________  FINAL CLINICAL IMPRESSION(S) / ED DIAGNOSES  Final diagnoses:  Chronic midline low back pain with bilateral sciatica  Fall, initial encounter      NEW MEDICATIONS STARTED DURING THIS VISIT:  New Prescriptions   METHOCARBAMOL (ROBAXIN) 500 MG TABLET    Take 1 tablet (500 mg total) by mouth 4 (four) times daily.        This chart was dictated using voice recognition software/Dragon. Despite best efforts to proofread, errors can occur which can change the meaning. Any change was purely unintentional.    Racheal PatchesJonathan D Deitra Craine, PA-C 02/05/16 1855  Arnaldo NatalPaul  F Malinda, MD 02/06/16 0100

## 2016-02-05 NOTE — ED Notes (Signed)
Pt tripped over the dog and fell.  Pt has lower back pain.  Pt has chronic back pain.  Pt alert.

## 2016-02-05 NOTE — ED Notes (Signed)
poct pregnancy Negative 

## 2016-02-05 NOTE — ED Notes (Signed)
Pt tripped over a dog and fell onto wooden floor today.  Pt has low back pain.  Pt has hx chronic pain.  Pt tearful.

## 2016-02-05 NOTE — ED Notes (Signed)
Went in to give injection for pain.  Pt refused PA aware

## 2016-03-12 IMAGING — US US RENAL
1 series · 14 of 25 positions shown · non-contrast
Comparison: None.

CLINICAL DATA: 38-year-old female with increasing pain and known
right side Renal stone, nephrolithiasis. Initial encounter.

EXAM:
RENAL/URINARY TRACT ULTRASOUND COMPLETE

[Series 1: us renal · 0.27mm/px · 14 of 30 slices shown]
[im 1/30]
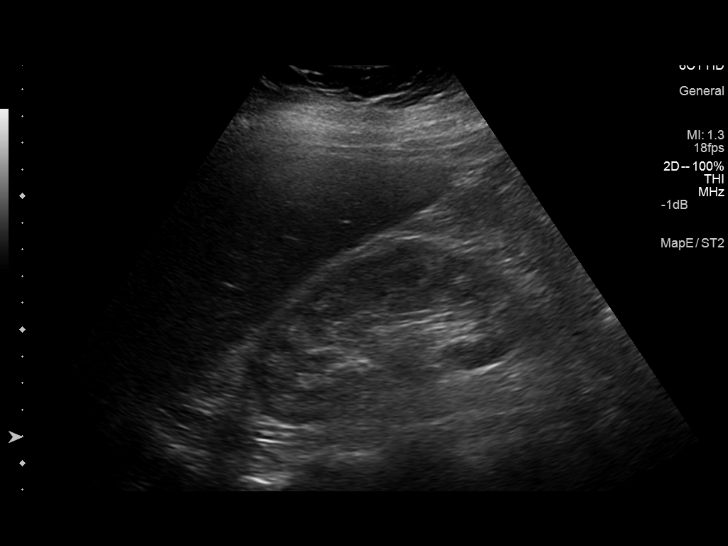
[im 3/30]
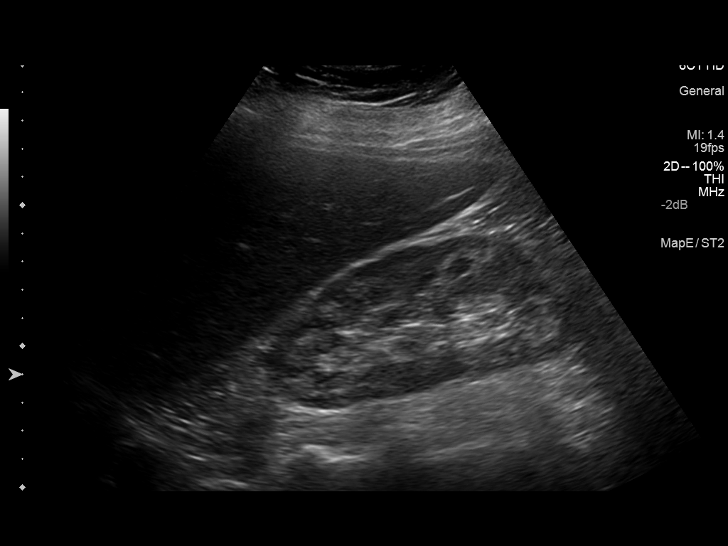
[im 5/30]
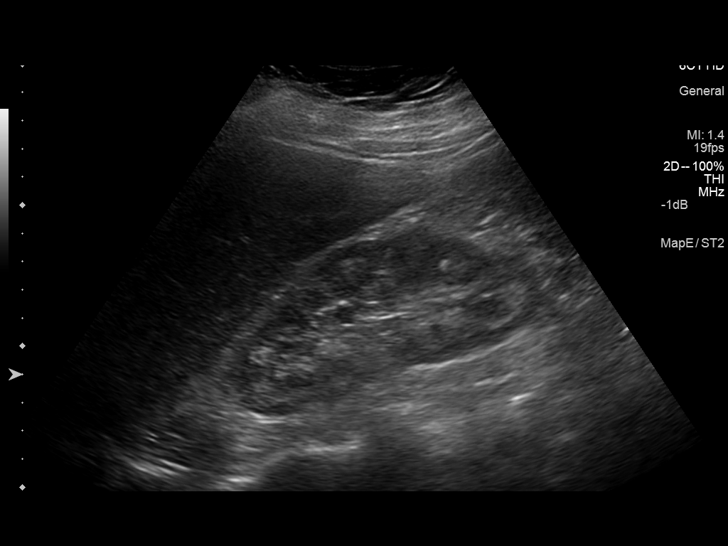
[im 8/30]
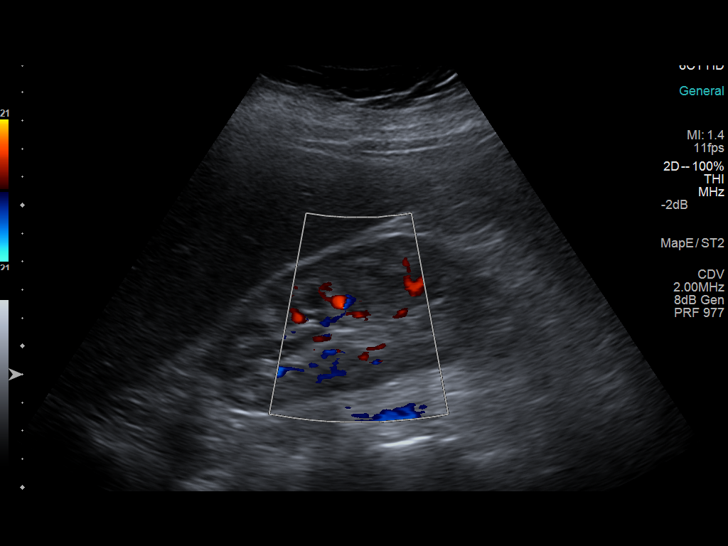
[im 10/30]
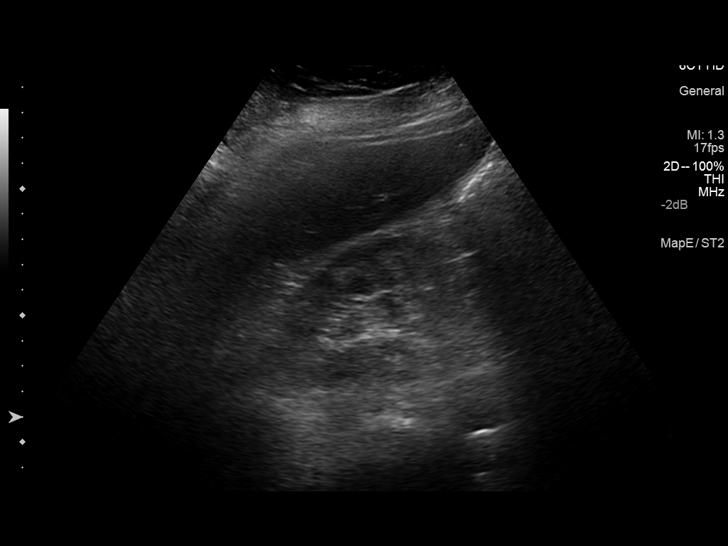
[im 11/30]
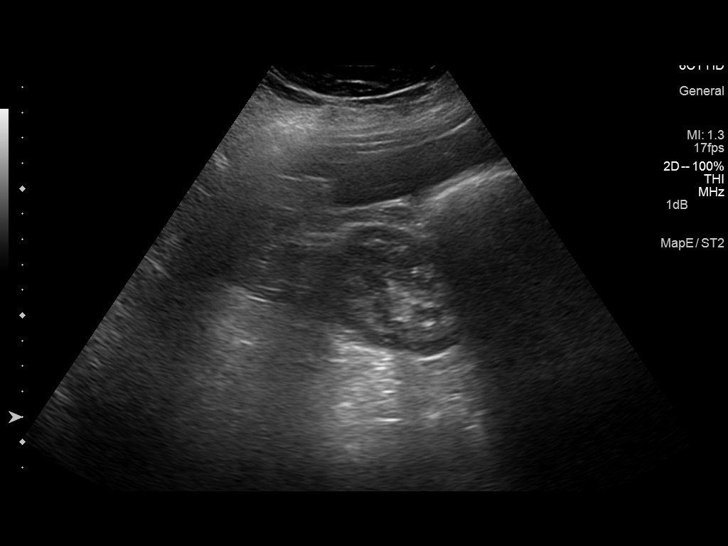
[im 14/30]
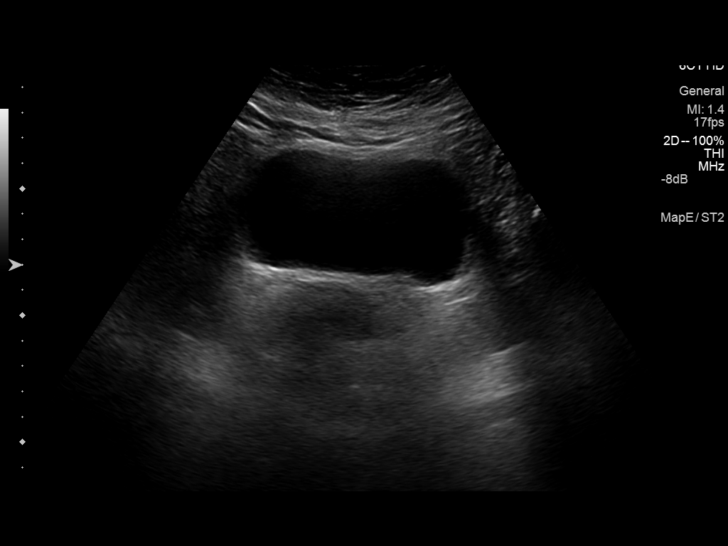
[im 16/30]
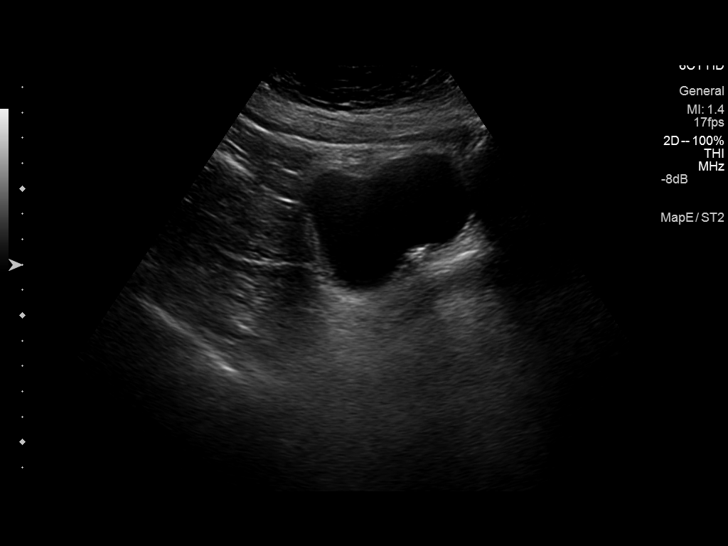
[im 19/30]
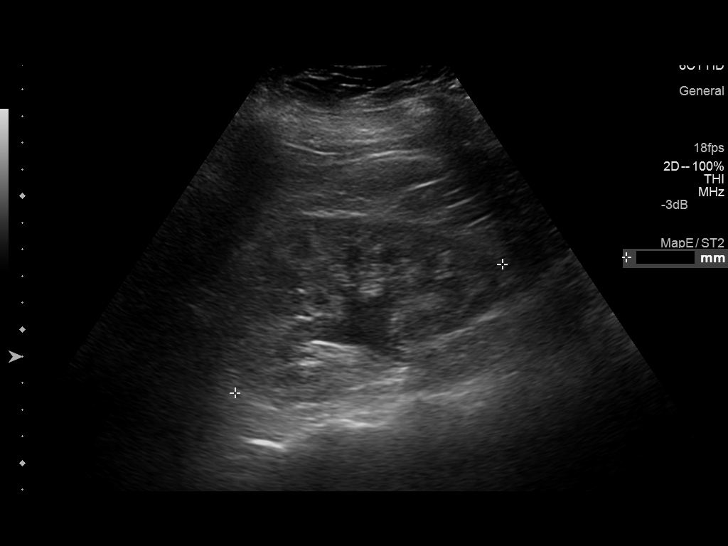
[im 20/30]
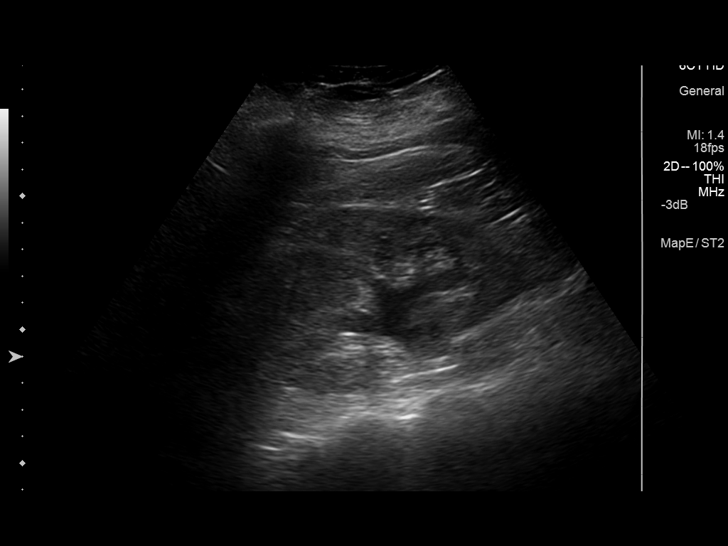
[im 22/30]
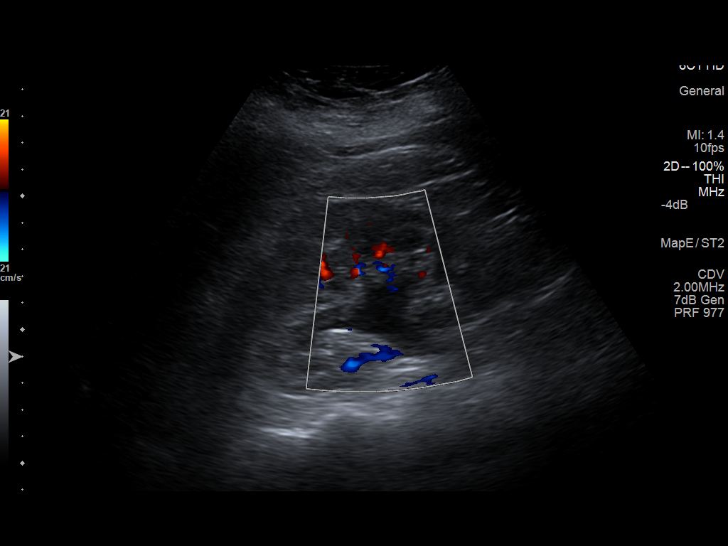
[im 25/30]
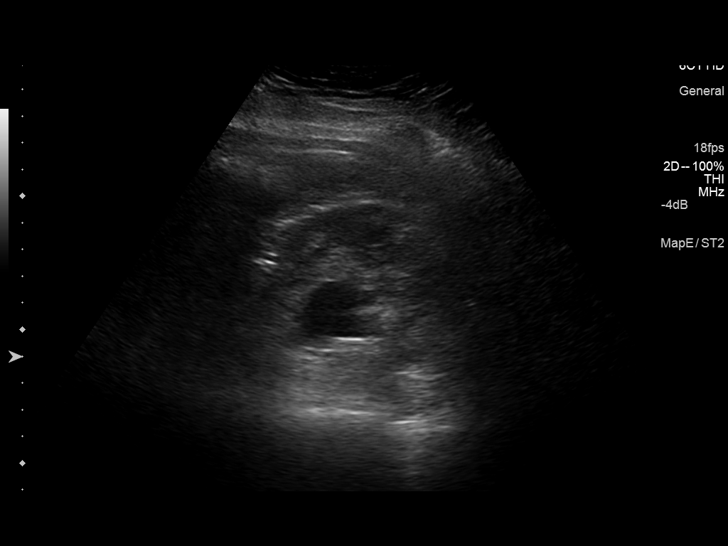
[im 27/30]
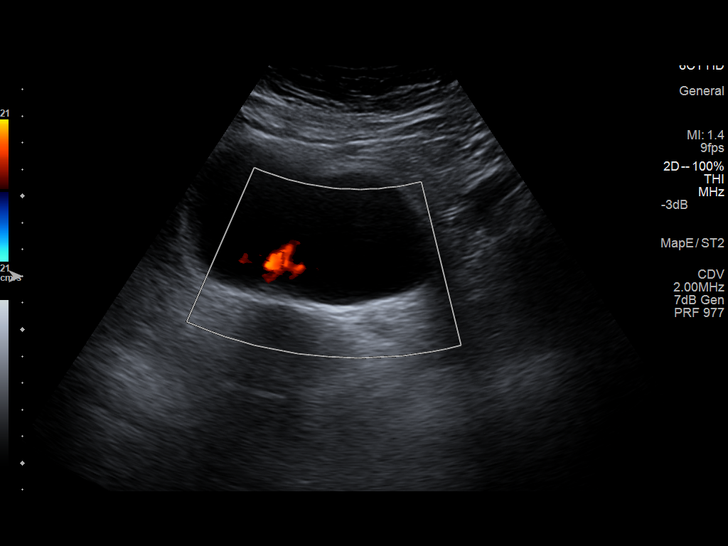
[im 30/30]
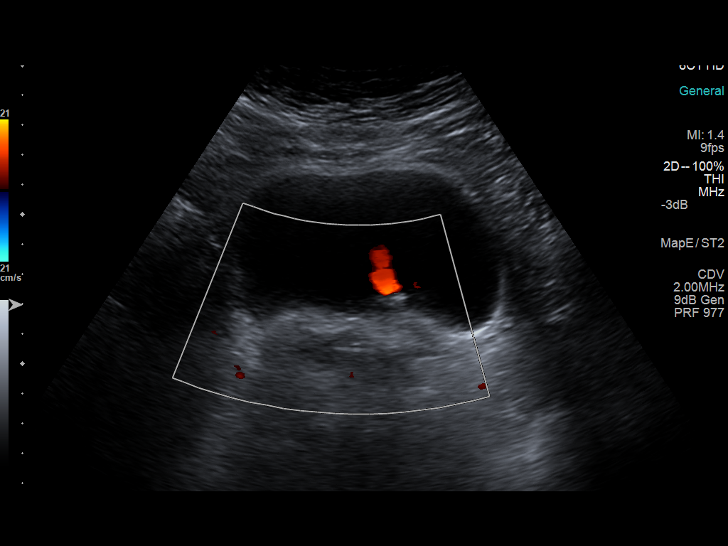

[14 of 25 positions shown; findings below may reference images not displayed]

FINDINGS: Right Kidney:

Length: No hydronephrosis.. Thinning of the renal cortex for age.
Hyperechoic medullary pyramids, but no definite shadowing
identified. No space-occupying mass identified.

Left Kidney:

Length: 11.0 cm. Mild hydronephrosis (image 23). Renal calices are
less dilated. Similar cortical thinning to that on the right with
increased medullary echogenicity.

Bladder:

No layering debris identified.  Both ureteral jets are detected.
IMPRESSION: 1. Mild left hydronephrosis. Difficult to exclude acute obstructive
uropathy on that side even though the left ureteral jet is
identified.
2. Bilateral renal cortical thinning/atrophy, with a pattern of
medullary hyper echogenicity. This may indicate nephrocalcinosis or
possibly medullary sponge kidney.

## 2017-11-07 IMAGING — CR DG LUMBAR SPINE COMPLETE 4+V
1 series · 6 of 6 positions shown · non-contrast
Comparison: None.

CLINICAL DATA: Low back pain.  Injury today.

EXAM:
LUMBAR SPINE - COMPLETE 4+ VIEW

[Series 1: dg lumbar spine complete 4 +v · 0.14mm/px · 6 of 6 slices shown]
[im 1/6]
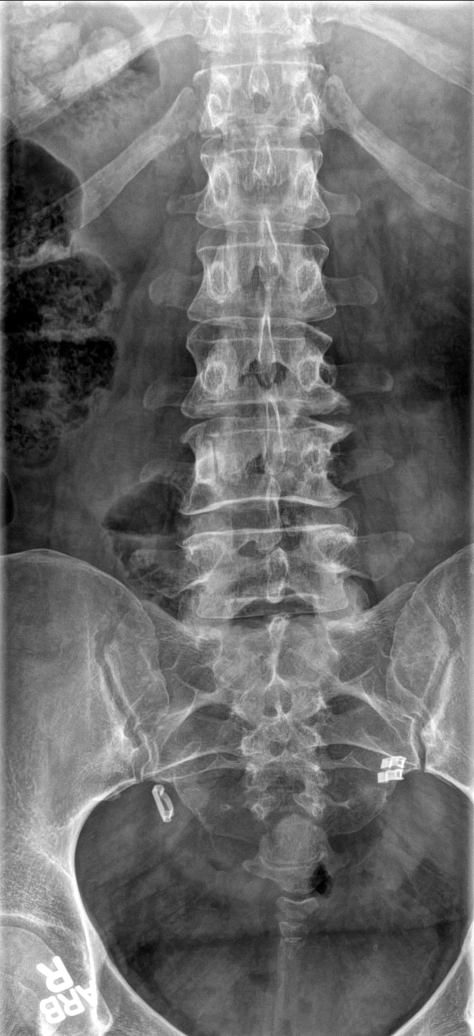
[im 2/6]
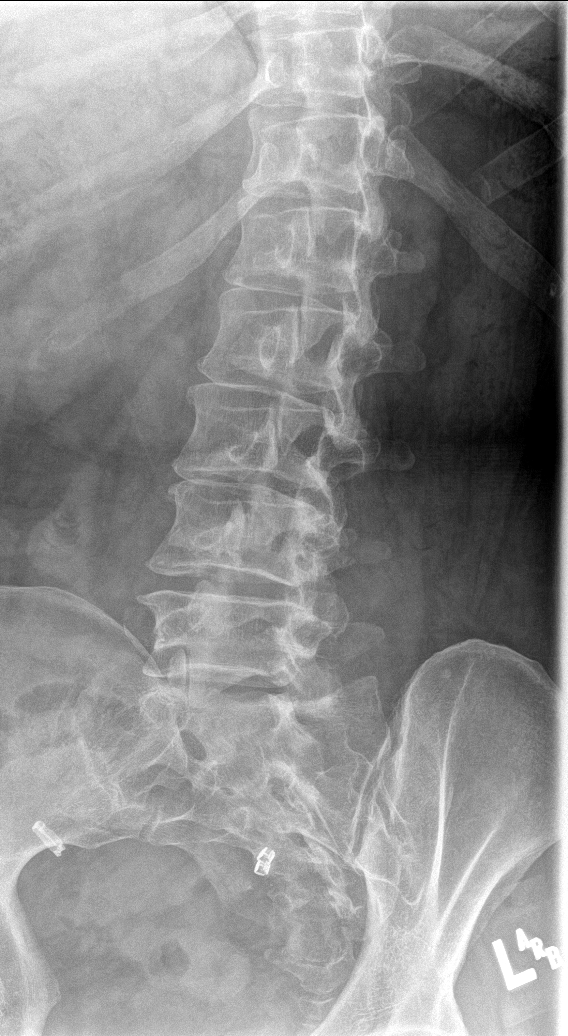
[im 3/6]
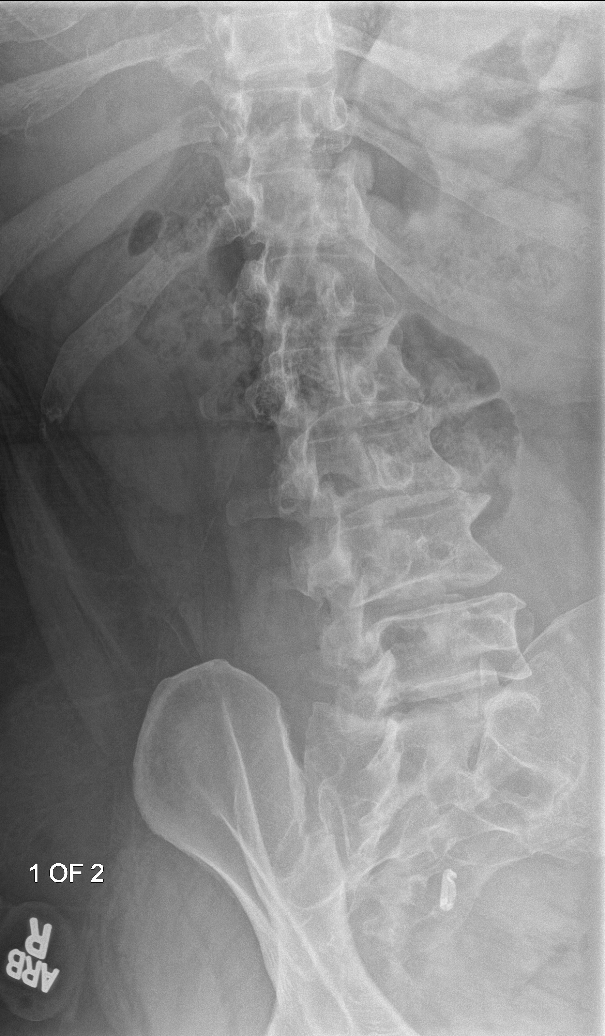
[im 4/6]
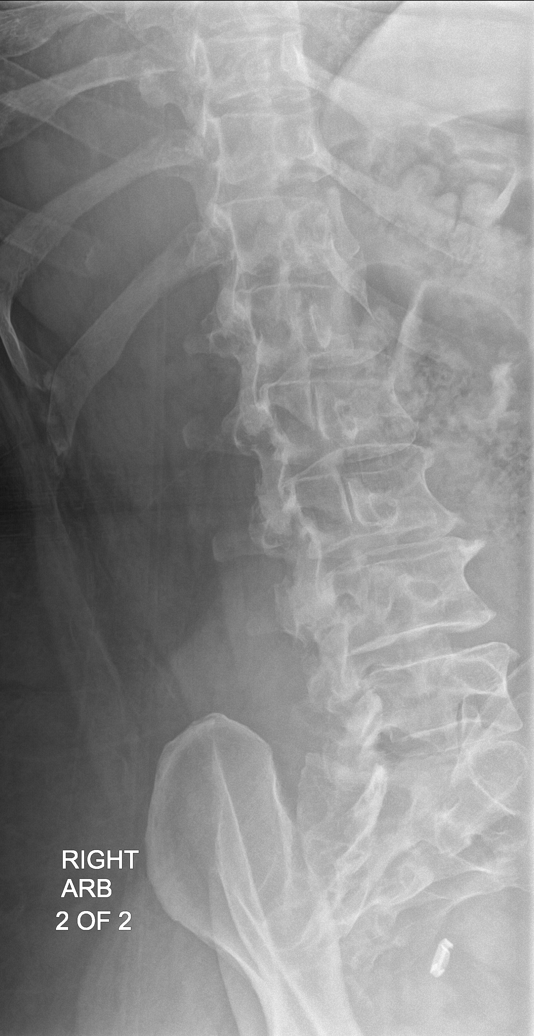
[im 5/6]
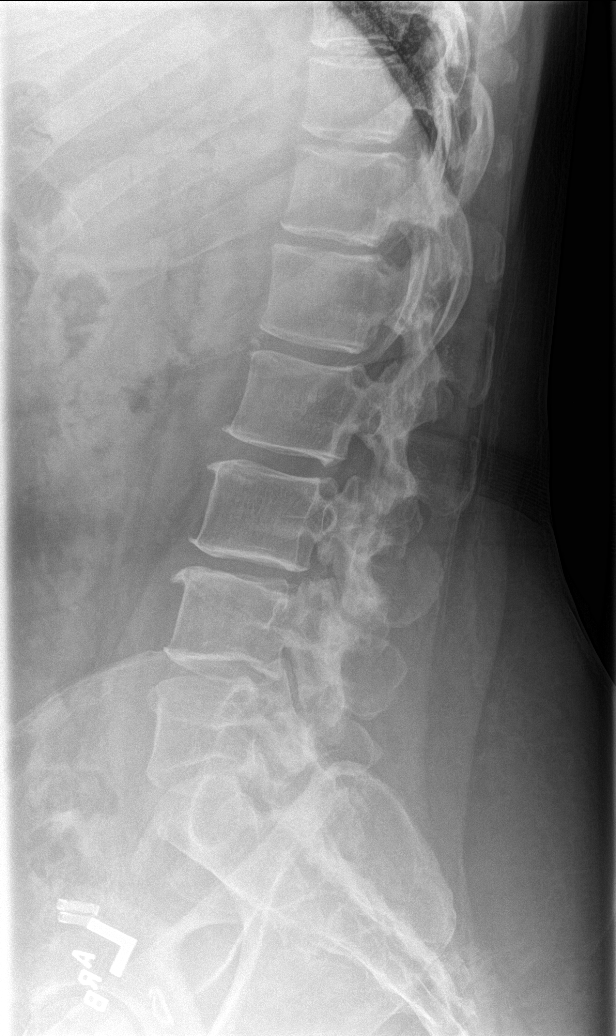
[im 6/6]
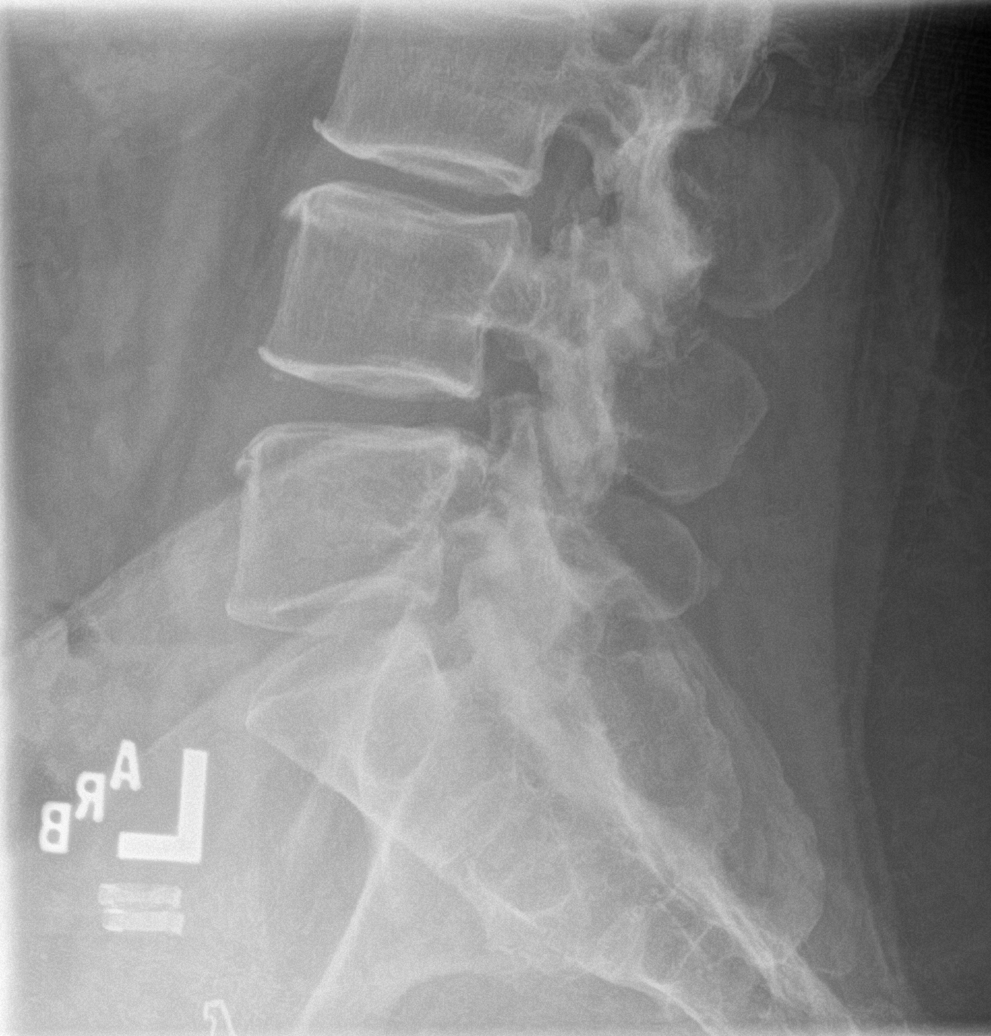

[6 of 6 positions shown; findings below may reference images not displayed]

FINDINGS: There is anatomic alignment. There is no vertebral compression
deformity. There is moderate narrowing of the L3-4, L4-5, and L5-S1
discs. There is anterior osteophyte formation throughout the lumbar
spine. There is facet arthropathy a at L3-4, L4-5, and L5-S1. No
pars defect. Mild degenerative change of the SI joints.
IMPRESSION: No acute bony pathology.  Mild degenerative change.
